# Patient Record
Sex: Female | Born: 1980 | Race: Black or African American | Hispanic: No | Marital: Single | Smoking: Current every day smoker
Health system: Southern US, Community
[De-identification: ages and names within clinical notes are randomized; demographics above are authoritative.]

---

## 2011-12-14 ENCOUNTER — Emergency Department (HOSPITAL_COMMUNITY)
Admission: EM | Admit: 2011-12-14 | Discharge: 2011-12-15 | Disposition: A | Discharge status: Home / Self Care | Payer: 59 | Attending: Emergency Medicine | Admitting: Emergency Medicine

## 2011-12-14 ENCOUNTER — Encounter (HOSPITAL_COMMUNITY): Payer: Self-pay | Admitting: *Deleted

## 2011-12-14 DIAGNOSIS — F1210 Cannabis abuse, uncomplicated: Secondary | ICD-10-CM | POA: Insufficient documentation

## 2011-12-14 DIAGNOSIS — F1290 Cannabis use, unspecified, uncomplicated: Secondary | ICD-10-CM

## 2011-12-14 DIAGNOSIS — R002 Palpitations: Secondary | ICD-10-CM | POA: Insufficient documentation

## 2011-12-14 LAB — POCT I-STAT, CHEM 8
BUN: 13 mg/dL (ref 6–23)
Chloride: 105 mEq/L (ref 96–112)
Creatinine, Ser: 1.1 mg/dL (ref 0.50–1.10)
Glucose, Bld: 121 mg/dL — ABNORMAL HIGH (ref 70–99)
HCT: 39 % (ref 36.0–46.0)
Potassium: 3.2 mEq/L — ABNORMAL LOW (ref 3.5–5.1)

## 2011-12-14 NOTE — ED Notes (Signed)
Patient was sitting when she started having experiencing palpitations.  Patient states that this week she has noticed that her chest was hurting.  Patient was smoking marijuana tonight and sometime after wards she noticed her symptoms.  Patient denies any chest pain but she is mostly shaking.

## 2011-12-14 NOTE — ED Provider Notes (Signed)
History     CSN: [id number]  Arrival date & time 12/14/11  2304   First MD Initiated Contact with Patient 12/14/11 2345      Chief Complaint  Patient presents with  . Palpitations    (Consider location/radiation/quality/duration/timing/severity/associated sxs/prior treatment) Patient is a 31 y.o. female presenting with palpitations. The history is provided by the patient. No language interpreter was used.  Palpitations  This is a recurrent problem. The current episode started 1 to 2 hours ago. The problem occurs constantly. The problem has been resolved. Associated with: smoking marijuana. Associated symptoms include chest pain. Pertinent negatives include no diaphoresis, no numbness, no exertional chest pressure, no irregular heartbeat, no orthopnea, no syncope, no abdominal pain, no vomiting, no headaches, no lower extremity edema, no dizziness, no cough and no shortness of breath. She has tried nothing for the symptoms. The treatment provided significant relief. Risk factors include smoking/tobacco exposure. Her past medical history does not include heart disease or hyperthyroidism.    History reviewed. No pertinent past medical history.  History reviewed. No pertinent past surgical history.  History reviewed. No pertinent family history.  History  Substance Use Topics  . Smoking status: Never Smoker   . Smokeless tobacco: Not on file  . Alcohol Use: Yes    OB History    Grav Para Term Preterm Abortions TAB SAB Ect Mult Living                  Review of Systems  Constitutional: Negative for diaphoresis.  Respiratory: Negative for cough and shortness of breath.   Cardiovascular: Positive for chest pain and palpitations. Negative for orthopnea, leg swelling and syncope.  Gastrointestinal: Negative for vomiting and abdominal pain.  Neurological: Negative for dizziness, numbness and headaches.  All other systems reviewed and are negative.    Allergies  Review of  patient's allergies indicates no known allergies.  Home Medications  No current outpatient prescriptions on file.  There were no vitals taken for this visit.  Physical Exam  Constitutional: She is oriented to person, place, and time. She appears well-developed and well-nourished. No distress.  HENT:  Head: Normocephalic and atraumatic.  Mouth/Throat: Oropharynx is clear and moist.  Eyes: Conjunctivae normal are normal. Pupils are equal, round, and reactive to light.  Neck: Normal range of motion. Neck supple.  Cardiovascular: Normal rate and regular rhythm.   Pulmonary/Chest: Effort normal and breath sounds normal. She has no wheezes. She has no rales.  Abdominal: Soft. Bowel sounds are normal. There is no tenderness. There is no rebound and no guarding.  Musculoskeletal: Normal range of motion.  Neurological: She is alert and oriented to person, place, and time.  Skin: Skin is warm and dry.  Psychiatric: She has a normal mood and affect.    ED Course  Procedures (including critical care time)  Labs Reviewed  POCT I-STAT, CHEM 8 - Abnormal; Notable for the following:    Potassium 3.2 (*)     Glucose, Bld 121 (*)     All other components within normal limits  POCT I-STAT TROPONIN I  CBC  D-DIMER, QUANTITATIVE   No results found.   No diagnosis found.    MDM   Date: 12/14/2011  Rate: 103  Rhythm: sinus tachycardia  QRS Axis: normal  Intervals: normal  ST/T Wave abnormalities: normal  Conduction Disutrbances:none  Narrative Interpretation:   Old EKG Reviewed: none available     Suspect palpitations secondary to marijuana use.  Negative DDimer and negative EKG  and cardiac marker.  Will refer to family physician for ongoing care.  Return for chest pain, shortness of breath or any concerns.  Stop marijuana     Doniel Maiello K Sharief Wainwright-Rasch, MD 12/15/11 [phone]

## 2011-12-15 ENCOUNTER — Emergency Department (HOSPITAL_COMMUNITY): Payer: 59

## 2011-12-15 LAB — CBC
HCT: 37.4 % (ref 36.0–46.0)
Hemoglobin: 12.9 g/dL (ref 12.0–15.0)
RBC: 4.03 MIL/uL (ref 3.87–5.11)
WBC: 10.6 10*3/uL — ABNORMAL HIGH (ref 4.0–10.5)

## 2011-12-15 LAB — D-DIMER, QUANTITATIVE: D-Dimer, Quant: <0.27 ug/mL-FEU (ref 0.00–0.48)

## 2011-12-15 MED ORDER — POTASSIUM CHLORIDE CRYS ER 20 MEQ PO TBCR
30.0000 meq | EXTENDED_RELEASE_TABLET | Freq: Once | ORAL | Status: AC
  Administered 2011-12-15: 30 meq via ORAL
  Filled 2011-12-15: qty 2

## 2011-12-15 NOTE — ED Notes (Signed)
Pt is aware that we need to collect urine.

## 2011-12-15 NOTE — ED Notes (Signed)
Called lab about D-dimer.  They stated it is running and will be completed shortly.

## 2012-02-16 ENCOUNTER — Other Ambulatory Visit: Payer: Self-pay | Admitting: Family Medicine

## 2012-02-20 ENCOUNTER — Other Ambulatory Visit: Payer: Self-pay | Admitting: Family Medicine

## 2012-02-20 DIAGNOSIS — R42 Dizziness and giddiness: Secondary | ICD-10-CM

## 2012-02-20 DIAGNOSIS — R55 Syncope and collapse: Secondary | ICD-10-CM

## 2013-10-29 ENCOUNTER — Encounter: Payer: Self-pay | Admitting: *Deleted

## 2016-01-20 ENCOUNTER — Emergency Department (HOSPITAL_COMMUNITY): Payer: Self-pay

## 2016-01-20 ENCOUNTER — Encounter (HOSPITAL_COMMUNITY): Payer: Self-pay | Admitting: Emergency Medicine

## 2016-01-20 ENCOUNTER — Emergency Department (HOSPITAL_COMMUNITY)
Admission: EM | Admit: 2016-01-20 | Discharge: 2016-01-20 | Disposition: A | Discharge status: Home / Self Care | Payer: Self-pay | Attending: Physician Assistant | Admitting: Physician Assistant

## 2016-01-20 DIAGNOSIS — M7918 Myalgia, other site: Secondary | ICD-10-CM

## 2016-01-20 DIAGNOSIS — Y92410 Unspecified street and highway as the place of occurrence of the external cause: Secondary | ICD-10-CM | POA: Insufficient documentation

## 2016-01-20 DIAGNOSIS — M25561 Pain in right knee: Secondary | ICD-10-CM | POA: Insufficient documentation

## 2016-01-20 DIAGNOSIS — Y939 Activity, unspecified: Secondary | ICD-10-CM | POA: Insufficient documentation

## 2016-01-20 DIAGNOSIS — Y999 Unspecified external cause status: Secondary | ICD-10-CM | POA: Insufficient documentation

## 2016-01-20 MED ORDER — IBUPROFEN 800 MG PO TABS: 800.0000 mg | ORAL_TABLET | Freq: Three times a day (TID) | ORAL | 0 refills | Status: DC
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
## 2016-01-20 NOTE — ED Notes (Signed)
Pt A&OX4, ambulatory at d/c with steady gait, NAD and pt verbalized understanding of d/c instructions and follow up care.
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
## 2017-03-31 ENCOUNTER — Other Ambulatory Visit: Payer: Self-pay

## 2017-03-31 ENCOUNTER — Ambulatory Visit
Admission: EM | Admit: 2017-03-31 | Discharge: 2017-03-31 | Disposition: A | Discharge status: Home / Self Care | Payer: Worker's Compensation | Attending: Family Medicine | Admitting: Family Medicine

## 2017-03-31 ENCOUNTER — Encounter: Payer: Self-pay | Admitting: Gynecology

## 2017-03-31 DIAGNOSIS — S91331A Puncture wound without foreign body, right foot, initial encounter: Secondary | ICD-10-CM | POA: Diagnosis not present

## 2017-03-31 DIAGNOSIS — Z23 Encounter for immunization: Secondary | ICD-10-CM

## 2017-03-31 DIAGNOSIS — W450XXA Nail entering through skin, initial encounter: Secondary | ICD-10-CM

## 2017-03-31 MED ORDER — CIPROFLOXACIN HCL 500 MG PO TABS: 500.0000 mg | ORAL_TABLET | Freq: Two times a day (BID) | ORAL | 0 refills | Status: AC

## 2017-03-31 MED ORDER — TETANUS-DIPHTH-ACELL PERTUSSIS 5-2.5-18.5 LF-MCG/0.5 IM SUSP
0.5000 mL | Freq: Once | INTRAMUSCULAR | Status: AC
  Administered 2017-03-31: 0.5 mL via INTRAMUSCULAR

## 2017-03-31 NOTE — ED Triage Notes (Signed)
Work related injury. Per patient step on nail at work in her right foot.

## 2017-03-31 NOTE — ED Provider Notes (Signed)
[facility] ____________________________________________  Time seen: Approximately 3:27 PM  I have reviewed the triage vital signs and the nursing notes.   HISTORY  Chief Complaint Work Related Injury   HPI Amanda Wright is a 36 y.o. female presenting for evaluation post work-related injury.  Patient reports that just prior to arrival she was at work at [employer], walking down an aisle.  States that there was a palate that had broken and when she stepped over she stepped at the palate and a now went through her rubber soled boot.  States that initially she could not tell how far it penetrated her skin, reports after evaluating did not deeply penetrate, but reports did break the skin and cause bleeding.  Again reports did go through a rubber soled shoe.  Denies other pain or injuries.  Reports otherwise feels well.  States that she was having some throbbing pain earlier that was described as moderate, states that she took a BC powder on the way to the urgent care, and states that her pain is much improved.  States minimal pain at the foot at this time.  Reports is continue to remain ambulatory.  Denies fall or other injuries.  Unsure of last tetanus immunization.  Reports otherwise feels well.  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  Patient states that with pain much improved she feels like she can return to work as of normal.. Denies recent sickness. Denies recent antibiotic use.   Patient's last menstrual period was 03/24/2017.  Denies pregnancy.   History reviewed. No pertinent past medical history. Denies   There are no active problems to display for this patient.   History reviewed. No pertinent surgical history.   No current facility-administered medications for this encounter.   Current Outpatient Medications:  .  ciprofloxacin (CIPRO) 500 MG tablet, Take 1 tablet (500 mg total) by mouth 2 (two) times daily for 5 days., Disp: 10 tablet, Rfl:  0  Allergies Patient has no known allergies.  Family History  Problem Relation Age of Onset  . Healthy Mother   . Healthy Father     Social History Social History   Tobacco Use  . Smoking status: Current Every Day Smoker    Packs/day: 0.50    Types: Cigarettes  . Smokeless tobacco: Never Used  Substance Use Topics  . Alcohol use: Yes  . Drug use: Yes    Frequency: 2.0 times per week    Types: Marijuana    Review of Systems Constitutional: No fever/chills Cardiovascular: Denies chest pain. Respiratory: Denies shortness of breath. Gastrointestinal: No abdominal pain.  . Musculoskeletal: Negative for back pain. Skin: As above.   ____________________________________________   PHYSICAL EXAM:  VITAL SIGNS: ED Triage Vitals  Enc Vitals Group     BP 03/31/17 1417 134/86     Pulse Rate 03/31/17 1417 80     Resp 03/31/17 1417 16     Temp 03/31/17 1417 98.5 F (36.9 C)     Temp Source 03/31/17 1417 Oral     SpO2 03/31/17 1417 100 %     Weight 03/31/17 1403 135 lb (61.2 kg)     Height 03/31/17 1403 5\' 2"  (1.575 m)     Head Circumference --      Peak Flow --      Pain Score 03/31/17 1403 3     Pain Loc --      Pain Edu? --      Excl. in GC? --  Constitutional: Alert and oriented. Well appearing and in no acute distress. Cardiovascular: Normal rate, regular rhythm. Grossly normal heart sounds.  Good peripheral circulation. Respiratory: Normal respiratory effort without tachypnea nor retractions. Breath sounds are clear and equal bilaterally. No wheezes, rales, rhonchi. Musculoskeletal: Bilateral pedal pulses equal and easily palpated.  Ambulatory with steady gait.  Except: Plantar aspect of right lateral foot minimal tenderness to direct palpation, plantar foot with multiple callused cracking areas without clear focal puncture, no point bony tenderness, no bleeding, no drainage, no erythema, full range of motion present, normal distal sensation and capillary  refill, amatory with a steady gait. Neurologic:  Normal speech and language.Speech is normal. No gait instability.  Skin:  Skin is warm, dry as above.Marland Kitchen.  Psychiatric: Mood and affect are normal. Speech and behavior are normal. Patient exhibits appropriate insight and judgment   ___________________________________________   LABS (all labs ordered are listed, but only abnormal results are displayed)  Labs Reviewed - No data to display ____________________________________________  RADIOLOGY  No results found.   Discussed evaluation of x-ray with patient, patient declined as stating pain is minimal ____________________________________________   PROCEDURES Procedures   Very well-appearing patient.  No acute distress.  Presenting for evaluation of INITIAL IMPRESSION / ASSESSMENT AND PLAN / ED COURSE  Pertinent labs & imaging results that were available during my care of the patient were reviewed by me and considered in my medical decision making (see chart for details).  Of Worker's Compensation injury.  Reports nail went through her rubber soled shoe.  No clear puncture wound noted.  Patient reports that her sock was covered in blood from the injury.  Patient does have multiple callused areas to the plantar feet and several areas of callused cracks and unable to fully determine puncture insertion.  Discussed x-ray, patient declined.  No point bony tenderness.  No erythema.  Discussed antibiotic use with patient, patient verbalized understanding including side effects antibiotic, will place patient on Cipro twice daily times 5 days due to rubber sole involvement.  Tetanus immunization updated.  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  Note given.  Follow-up with outpatient Janalyn HarderSamantha Singer as needed.  Discussed keeping clean and monitoring.Discussed indication, risks and benefits of medications with patient.  Discussed follow up and return  parameters including no resolution or any worsening concerns. Patient verbalized understanding and agreed to plan.   ____________________________________________   FINAL CLINICAL IMPRESSION(S) / ED DIAGNOSES  Final diagnoses:  Nail wound of right foot, initial encounter     ED Discharge Orders        Ordered    ciprofloxacin (CIPRO) 500 MG tablet  2 times daily     03/31/17 1521       Note: This dictation was prepared with Dragon dictation along with smaller phrase technology. Any transcriptional errors that result from this process are unintentional.         Renford DillsMiller, Darby Shadwick, NP 03/31/17 1831

## 2017-03-31 NOTE — Discharge Instructions (Signed)
Take medication as prescribed. Rest. Drink plenty of fluids. Keep clean and monitor.  Follow up with your primary care physician this week as needed. Return to Urgent care as needed.  Follow-up with the above as needed for continued pain.

## 2017-09-19 ENCOUNTER — Other Ambulatory Visit: Payer: Self-pay | Admitting: Family Medicine

## 2017-09-19 DIAGNOSIS — R519 Headache, unspecified: Secondary | ICD-10-CM

## 2017-09-19 DIAGNOSIS — R51 Headache: Principal | ICD-10-CM

## 2019-08-27 ENCOUNTER — Other Ambulatory Visit: Payer: Self-pay | Admitting: Family Medicine

## 2019-08-27 DIAGNOSIS — Z1231 Encounter for screening mammogram for malignant neoplasm of breast: Secondary | ICD-10-CM

## 2019-09-30 ENCOUNTER — Other Ambulatory Visit: Payer: Self-pay

## 2019-09-30 ENCOUNTER — Ambulatory Visit
Admission: RE | Admit: 2019-09-30 | Discharge: 2019-09-30 | Disposition: A | Discharge status: Home / Self Care | Payer: Self-pay | Source: Ambulatory Visit | Attending: Family Medicine | Admitting: Family Medicine

## 2019-09-30 DIAGNOSIS — Z1231 Encounter for screening mammogram for malignant neoplasm of breast: Secondary | ICD-10-CM

## 2021-10-25 IMAGING — MG DIGITAL SCREENING BILAT W/ CAD
4 series · 4 of 4 positions shown · non-contrast
Comparison: None.

CLINICAL DATA: Screening.

EXAM:
DIGITAL SCREENING BILATERAL MAMMOGRAM WITH CAD

[L CC]
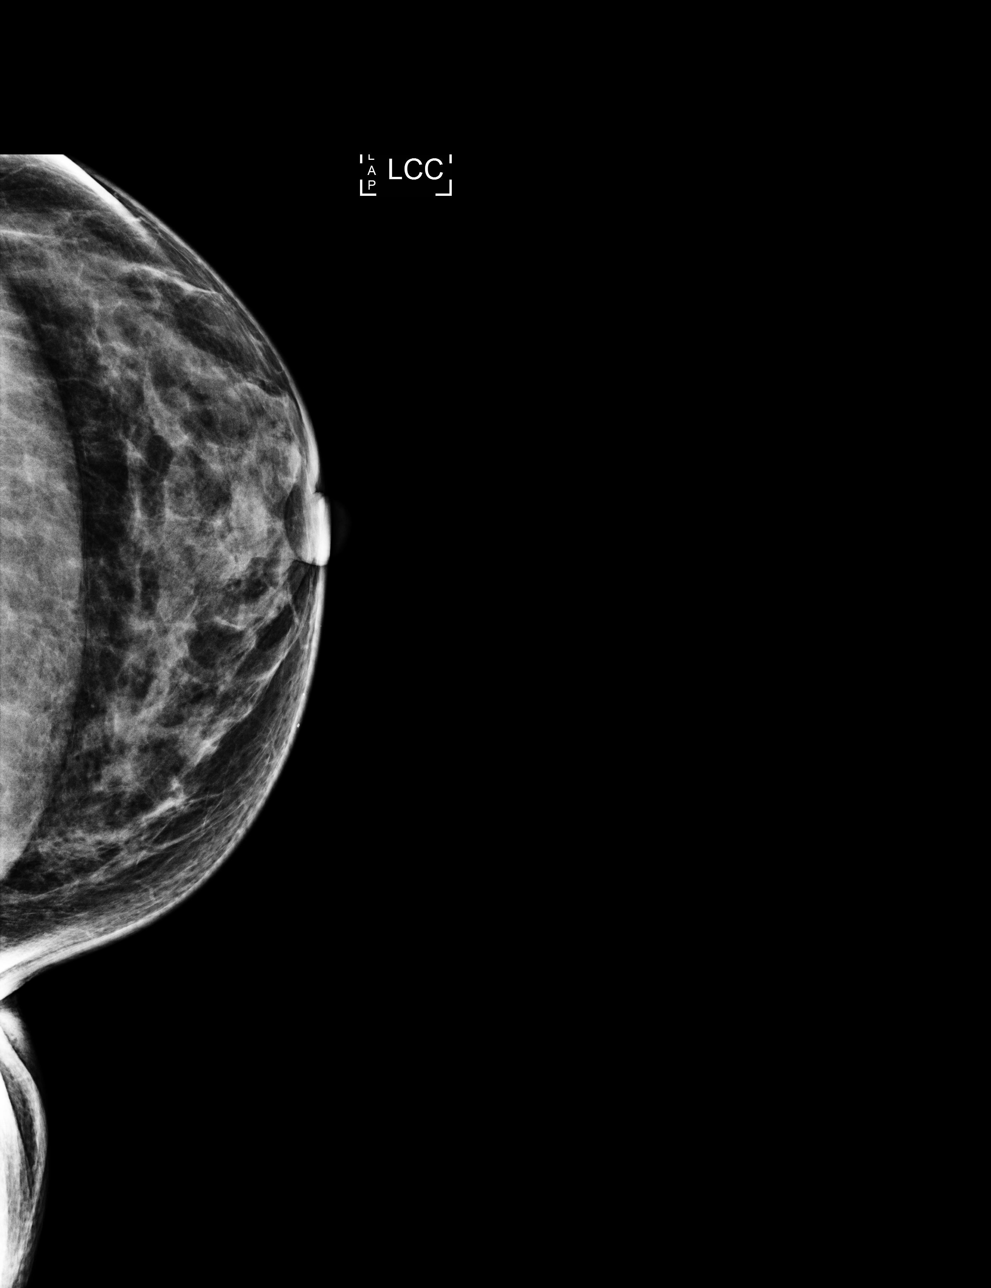

[R MLO]
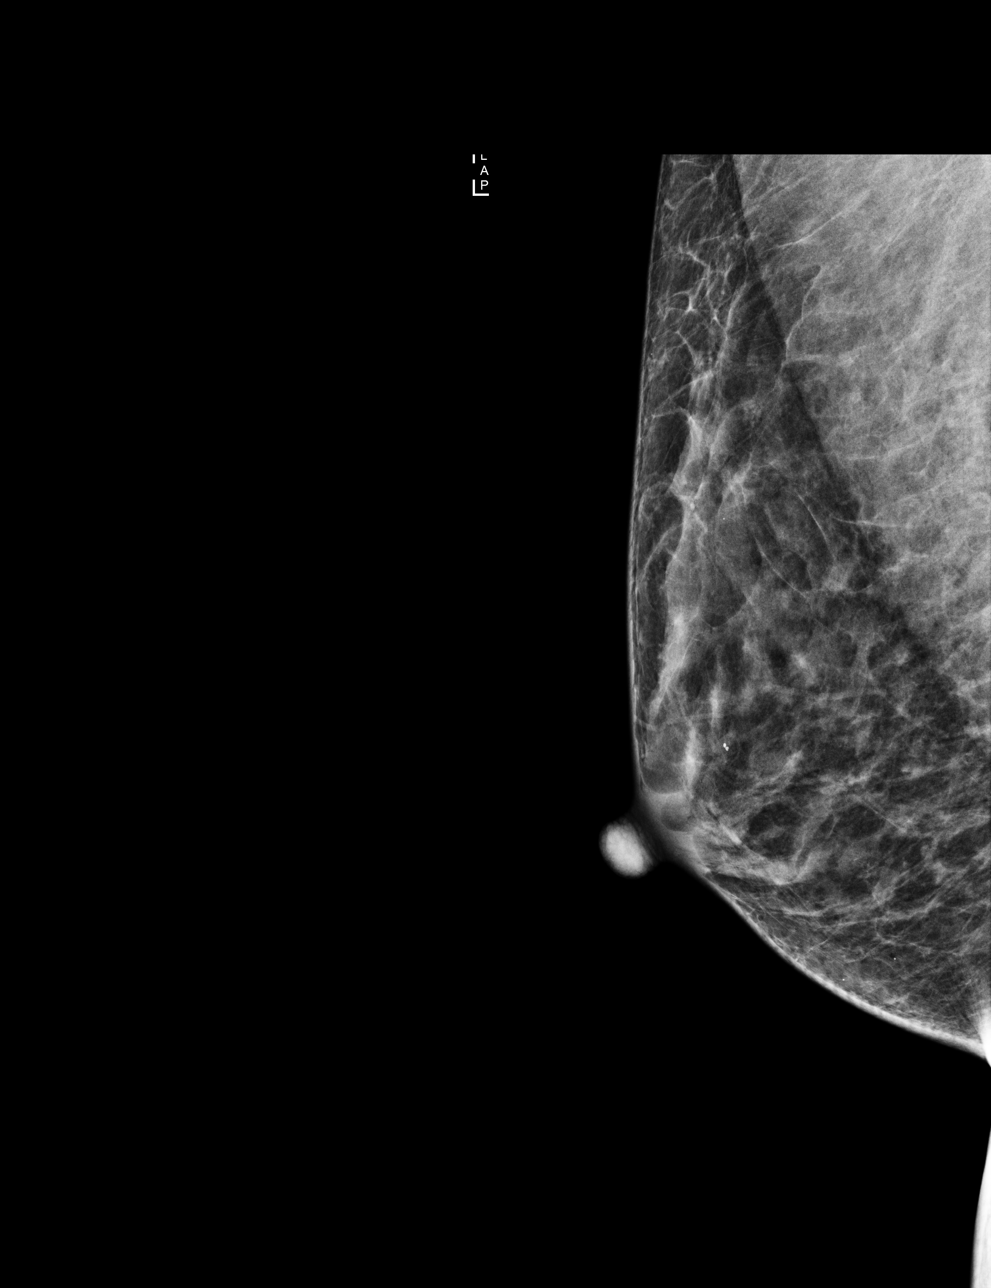

[R CC]
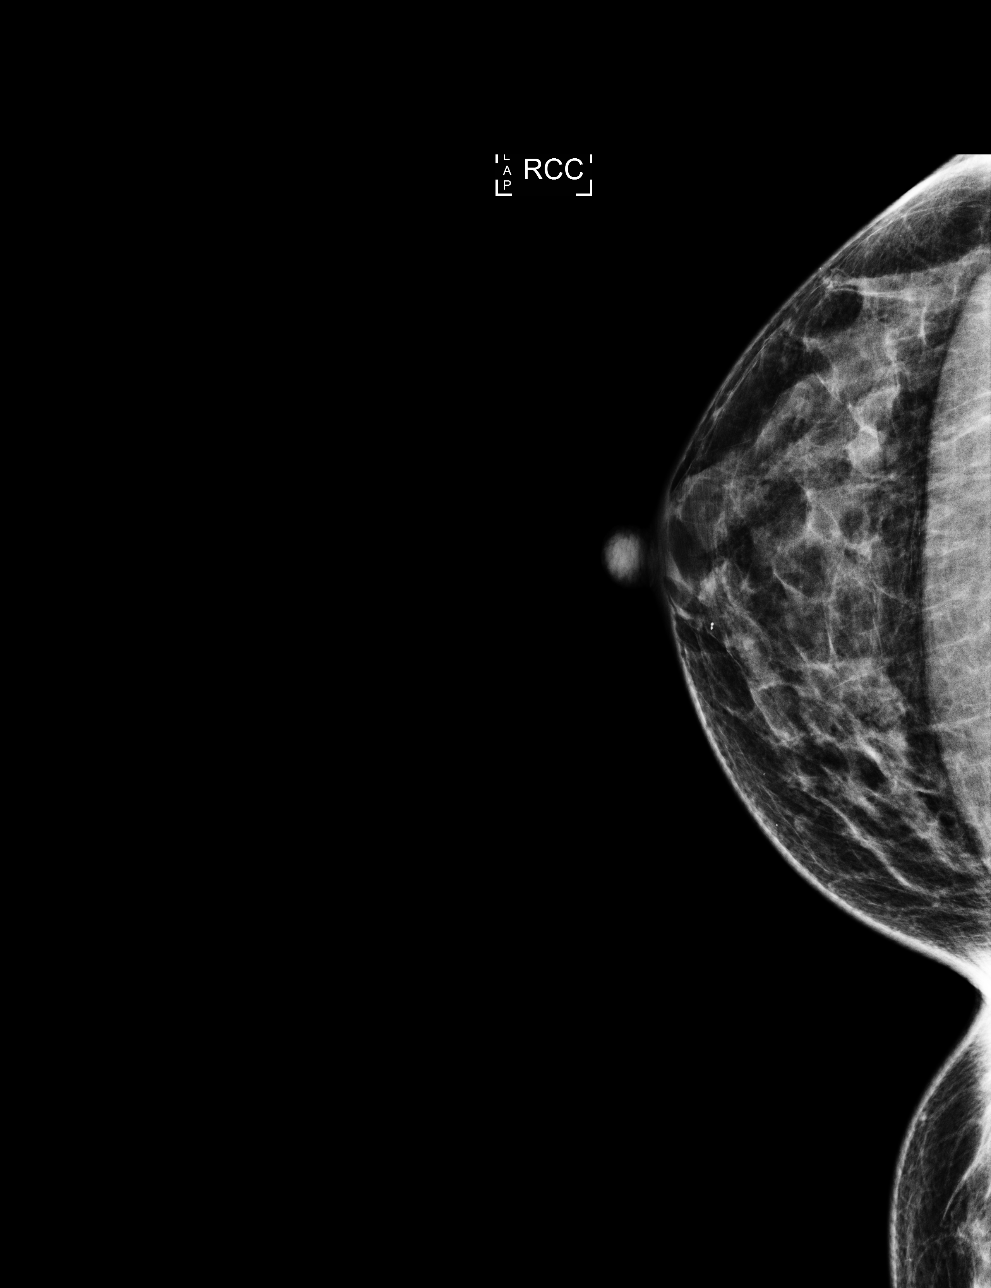

[L MLO]
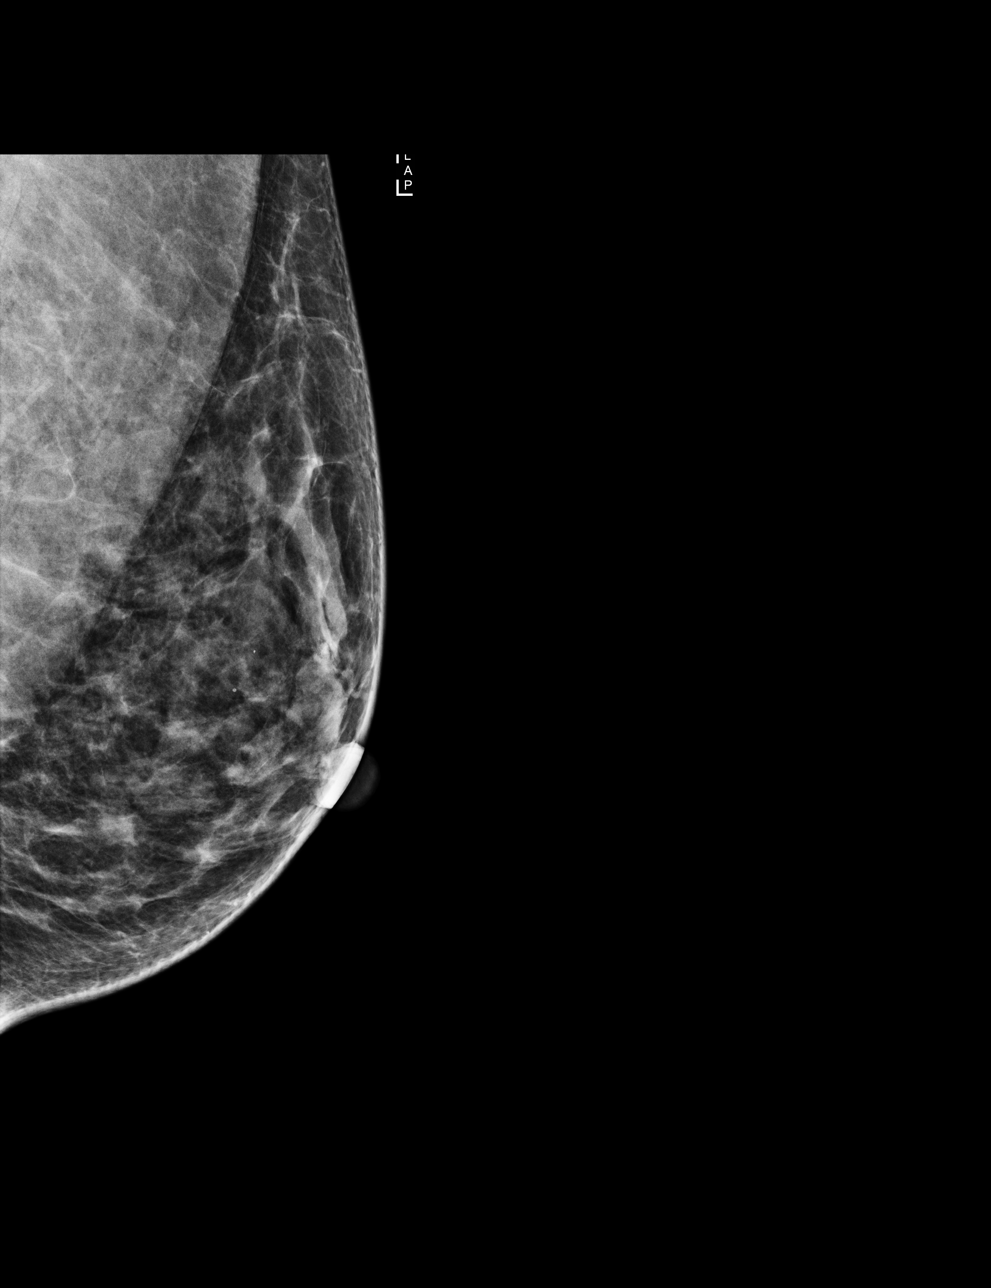

[4 of 4 positions shown; findings below may reference images not displayed]

ACR Breast Density Category c: The breast tissue is heterogeneously
dense, which may obscure small masses
FINDINGS: There are no findings suspicious for malignancy. Images were
processed with CAD.
IMPRESSION: No mammographic evidence of malignancy. A result letter of this
screening mammogram will be mailed directly to the patient.

RECOMMENDATION:
Screening mammogram in one year. (Code:U2-0-761)

BI-RADS CATEGORY  1: Negative.
